# Patient Record
Sex: Female | Born: 2000 | Hispanic: No | Marital: Single | Smoking: Never smoker
Health system: Southern US, Community
[De-identification: ages and names within clinical notes are randomized; demographics above are authoritative.]

## PROBLEM LIST (undated history)

## (undated) DIAGNOSIS — T7840XA Allergy, unspecified, initial encounter: Secondary | ICD-10-CM

## (undated) HISTORY — DX: Allergy, unspecified, initial encounter: T78.40XA

---
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
## 2021-06-22 HISTORY — PX: WISDOM TOOTH EXTRACTION: SHX21

## 2021-11-21 ENCOUNTER — Ambulatory Visit (INDEPENDENT_AMBULATORY_CARE_PROVIDER_SITE_OTHER): Payer: BC Managed Care – PPO | Admitting: Family Medicine

## 2021-11-21 ENCOUNTER — Encounter: Payer: Self-pay | Admitting: Family Medicine

## 2021-11-21 VITALS — BP 99/60 | HR 99 | Temp 98.6°F | Ht 62.0 in | Wt 89.5 lb

## 2021-11-21 DIAGNOSIS — Z0000 Encounter for general adult medical examination without abnormal findings: Secondary | ICD-10-CM

## 2021-11-21 NOTE — Progress Notes (Signed)
Phone [phone]   Subjective:   Patient is a 21 y.o. female presenting for annual physical.    Chief Complaint  Patient presents with   Establish Care    Need annual check-up   New pt.  Here for cpx Was seeing peds Painful menses-on ocp and doing well.  Not SA ever.   Wt has lost few #.  Had URI so not eating as much.   See problem oriented charting- ROS- ROS: Gen: no fever, chills  Skin: no rash, itching ENT: no ear pain, ear drainage, nasal congestion, rhinorrhea, sinus pressure, sore throat-seasonal allergies-claritin D works still ok.  Eyes: no blurry vision, double vision Resp: no cough, wheeze,SOB.   Walks. CV: no CP, palpitations, LE edema,  GI: no heartburn, n/v/d/c, abd pain GU: no dysuria, urgency, frequency, hematuria MSK: no joint pain, myalgias, back pain Neuro: no dizziness, weakness, vertigo.  Occ ha prior menses-no aura. Occ midol or ibu.   Psych: no depression, anxiety, insomnia, SI   The following were reviewed and entered/updated in epic: Past Medical History:  Diagnosis Date   Allergy childhood   seasonal allergies   There are no problems to display for this patient.  History reviewed. No pertinent surgical history.  Family History  Problem Relation Age of Onset   Hypertension Mother    Cancer Father    Hypertension Father    Parkinson's disease Father    Diabetes Paternal Grandfather    Hypertension Paternal Grandfather    Alzheimer's disease Paternal Grandfather     Medications- reviewed and updated Current Outpatient Medications  Medication Sig Dispense Refill   loratadine-pseudoephedrine (CLARITIN-D 24-HOUR) 10-240 MG 24 hr tablet      norethindrone-ethinyl estradiol-FE (LOESTRIN FE) 1-20 MG-MCG tablet      No current facility-administered medications for this visit.    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 DP 2+B LUNGS: CTAB. No wheezes ABDOMEN:  BS+, soft, NTND, No HSM, no masses EXT:  no edema MSK: no gross abnormalities.  NEURO: A&O x3.  CN II-XII intact.  PSYCH: normal mood. Good eye contact     Assessment and Plan   Health Maintenance counseling: 1. Anticipatory guidance: Patient counseled regarding regular dental exams q6 months, eye exams,  avoiding smoking and second hand smoke, limiting alcohol to 1 beverage per day, no illicit drugs.   2. Risk factor reduction:  Advised patient of need for regular exercise and diet rich and fruits and vegetables to reduce risk of heart attack and stroke. Exercise- regularly.  Wt Readings from Last 3 Encounters:  11/21/21 89 lb 8 oz (40.6 kg) (<1 %, Z= -2.89)*   * Growth percentiles are based on CDC (Girls, 2-20 Years) data.   3. Immunizations/screenings/ancillary studies Immunization History  Administered Date(s) Administered   DTaP 06/20/2002, 08/22/2002, 10/24/2002,  10/12/2003, 09/16/2006   H1N1 06/11/2008   HPV 9-valent 12/21/2014, 02/27/2015, 07/17/2015   Hepatitis A 12/21/2014, 07/17/2015   Hepatitis B 06/20/2002, 08/22/2002, 10/24/2002   HiB (PRP-OMP) 06/20/2002, 08/22/2002, 10/24/2002, 10/12/2003   IPV 06/20/2002, 08/22/2002, 10/24/2002, 09/16/2006   Influenza, Quadrivalent, Recombinant, Inj, Pf 05/19/2014, 06/02/2015, 06/13/2016   Influenza,Quad,Nasal, Live 06/13/2007, 07/15/2007, 05/16/2008, 06/29/2009, 06/19/2011, 04/24/2012, 05/07/2013   Influenza-Unspecified  05/21/2006, 07/14/2008, 05/13/2021   MMR 04/18/2003, 09/16/2006   MenQuadfi_Meningococcal Groups ACYW Conjugate 12/21/2014, 01/16/2019   Meningococcal B Recombinant 01/16/2019, 01/28/2019   PFIZER(Purple Top)SARS-COV-2 Vaccination 09/21/2019, 10/16/2019, 04/21/2020   Pfizer Covid-19 Vaccine Bivalent Booster 26yr & up 05/13/2021   Pneumococcal Conjugate PCV 7 06/20/2002, 08/22/2002, 07/13/2003   Tdap 12/21/2014   Varicella 04/18/2003, 09/16/2006   There are no preventive care reminders to display for this patient.   4. Cervical cancer screening: n/a 5. Skin cancer screening- advised regular sunscreen use. Denies worrisome, changing, or new skin lesions.  6. Birth control/STD check: n/a 7. Smoking associated screening: non smoker 8. Alcohol screening: none  Problem List Items Addressed This Visit   None Visit Diagnoses     Wellness examination    -  Primary       Recommended follow up: annual Return in about 1 year (around 11/22/2022) for annual. Future Appointments  Date Time Provider DDumas 11/24/2022  8:00 AM KTawnya Crook MD [facility]    Lab/Order associations:n/a fasting   ICD-10-CM   1. Wellness examination  Z00.00      Wellness-antic guidance.  Rhm utd.  Monitor wts.   No orders of the defined types were placed in this encounter.    AWellington Hampshire MD

## 2021-11-21 NOTE — Patient Instructions (Signed)
Welcome to [facility]! It was a pleasure meeting you today.  As discussed, Please schedule a 12 month follow up visit today.  If weight continues downward, let me know  PLEASE NOTE:  If you had any LAB tests please let us know if you have not heard back within a few days. You may see your results on MyChart before we have a chance to review them but we will give you a call once they are reviewed by [place]. If we ordered any REFERRALS today, please let us know if you have not heard from their office within the next week.  Let us know through MyChart if you are needing REFILLS, or have your pharmacy send [place] the request. You can also use MyChart to communicate with me or any office staff.  Please try these tips to maintain a healthy lifestyle:  Eat most of your calories during the day when you are active. Eliminate processed foods including packaged sweets (pies, cakes, cookies), reduce intake of potatoes, white bread, white pasta, and white rice. Look for whole grain options, oat flour or almond flour.  Each meal should contain half fruits/vegetables, one quarter protein, and one quarter carbs (no bigger than a computer mouse).  Cut down on sweet beverages. This includes juice, soda, and sweet tea. Also watch fruit intake, though this is a healthier sweet option, it still contains natural sugar! Limit to 3 servings daily.  Drink at least 1 glass of water with each meal and aim for at least 8 glasses per day  Exercise at least 150 minutes every week.

## 2021-12-12 ENCOUNTER — Telehealth: Payer: Self-pay | Admitting: Family Medicine

## 2021-12-12 ENCOUNTER — Other Ambulatory Visit: Payer: Self-pay | Admitting: *Deleted

## 2021-12-12 MED ORDER — LORATADINE-PSEUDOEPHEDRINE ER 10-240 MG PO TB24: 1.0000 | ORAL_TABLET | Freq: Every day | ORAL | 2 refills | Status: DC

## 2021-12-15 NOTE — Telephone Encounter (Signed)
Rx sent to the pharmacy.

## 2021-12-26 DIAGNOSIS — B2790 Infectious mononucleosis, unspecified without complication: Secondary | ICD-10-CM | POA: Diagnosis not present

## 2021-12-26 DIAGNOSIS — J029 Acute pharyngitis, unspecified: Secondary | ICD-10-CM | POA: Diagnosis not present

## 2022-01-26 ENCOUNTER — Other Ambulatory Visit: Payer: Self-pay | Admitting: Family Medicine

## 2022-01-26 ENCOUNTER — Telehealth: Payer: Self-pay | Admitting: Family Medicine

## 2022-01-26 MED ORDER — NORETHIN ACE-ETH ESTRAD-FE 1-20 MG-MCG PO TABS: 1.0000 | ORAL_TABLET | Freq: Every day | ORAL | 4 refills | Status: DC

## 2022-01-26 NOTE — Telephone Encounter (Signed)
Okay to refill? Please advise.  

## 2022-01-26 NOTE — Telephone Encounter (Signed)
..   Encourage patient to contact the pharmacy for refills or they can request refills through [facility]  LAST APPOINTMENT DATE: 11/21/21  NEXT APPOINTMENT DATE: 11/24/22  MEDICATION:  Loestrin FE  Is the patient out of medication?   [facility]   Let patient know to contact pharmacy at the end of the day to make sure medication is ready.  Please notify patient to allow 48-72 hours to process  CLINICAL FILLS OUT ALL BELOW:   LAST REFILL:  QTY:  REFILL DATE:    OTHER COMMENTS:    Okay for refill?  Please advise

## 2022-02-26 ENCOUNTER — Other Ambulatory Visit: Payer: Self-pay | Admitting: Family Medicine

## 2022-03-16 ENCOUNTER — Encounter: Payer: Self-pay | Admitting: *Deleted

## 2022-06-11 DIAGNOSIS — M278 Other specified diseases of jaws: Secondary | ICD-10-CM | POA: Diagnosis not present

## 2022-07-01 ENCOUNTER — Other Ambulatory Visit: Payer: Self-pay | Admitting: Family Medicine

## 2022-11-24 ENCOUNTER — Encounter: Payer: Self-pay | Admitting: Family Medicine

## 2022-11-24 ENCOUNTER — Ambulatory Visit (INDEPENDENT_AMBULATORY_CARE_PROVIDER_SITE_OTHER): Payer: BC Managed Care – PPO | Admitting: Family Medicine

## 2022-11-24 VITALS — BP 100/70 | HR 102 | Temp 98.1°F | Resp 16 | Ht 62.0 in | Wt 92.0 lb

## 2022-11-24 DIAGNOSIS — Z0001 Encounter for general adult medical examination with abnormal findings: Secondary | ICD-10-CM | POA: Diagnosis not present

## 2022-11-24 DIAGNOSIS — H6121 Impacted cerumen, right ear: Secondary | ICD-10-CM

## 2022-11-24 MED ORDER — NORETHIN ACE-ETH ESTRAD-FE 1-20 MG-MCG PO TABS: 1.0000 | ORAL_TABLET | Freq: Every day | ORAL | 4 refills | Status: DC

## 2022-11-24 NOTE — Patient Instructions (Addendum)
It was very nice to see you today!  Debrox daily for 5 days  and peroxide in the afternoon.  then flush again.     PLEASE NOTE:  If you had any lab tests please let us know if you have not heard back within a few days. You may see your results on MyChart before we have a chance to review them but we will give you a call once they are reviewed by [place]. If we ordered any referrals today, please let us know if you have not heard from their office within the next week.   Please try these tips to maintain a healthy lifestyle:  Eat most of your calories during the day when you are active. Eliminate processed foods including packaged sweets (pies, cakes, cookies), reduce intake of potatoes, white bread, white pasta, and white rice. Look for whole grain options, oat flour or almond flour.  Each meal should contain half fruits/vegetables, one quarter protein, and one quarter carbs (no bigger than a computer mouse).  Cut down on sweet beverages. This includes juice, soda, and sweet tea. Also watch fruit intake, though this is a healthier sweet option, it still contains natural sugar! Limit to 3 servings daily.  Drink at least 1 glass of water with each meal and aim for at least 8 glasses per day  Exercise at least 150 minutes every week.

## 2022-11-24 NOTE — Progress Notes (Signed)
Phone [phone]   Subjective:   Patient is a 22 y.o. female presenting for annual physical.    Chief Complaint  Patient presents with   Annual Exam    CPE Fasting Possible hemorrhoid    Annual.  Walks.  Anxious at doctor's or appointment so tachycardia.  Oral contraceptive pill(s) for cycles.  Not SA  See problem oriented charting- ROS- ROS: Gen: no fever, chills  Skin: no rash, itching ENT: no ear pain, ear drainage, nasal congestion, rhinorrhea, sinus pressure, sore throat.  Some seasonal allergies.  Uses Claritin D.  Occasional harder to equalize right ear since Nov. No pain.   Eyes: no blurry vision, double vision Resp: no cough, wheeze,SOB CV: no CP, palpitations, LE edema,  GI: no heartburn, n/v/d/c, abd pain.  Occasional bright red blood per rectum only on tissue.  Occasional itchy.  No pain. Harder stools.  Bowel movement q 2-3 days.  GU: no dysuria, urgency, frequency, hematuria.  Menses monthly on oral contraceptive pill(s).  No cramps.  G0.  Never SA MSK: no joint pain, myalgias, back pain.  Some TMJ Neuro: no dizziness, weakness, vertigo.  Occasional headache(s) prior menses.   Psych: no depression, anxiety, insomnia, SI   The following were reviewed and entered/updated in epic: Past Medical History:  Diagnosis Date   Allergy childhood   seasonal allergies   There are no problems to display for this patient.  Past Surgical History:  Procedure Laterality Date   WISDOM TOOTH EXTRACTION  2023    Family History  Problem Relation Age of Onset   Hypertension Mother    Cancer Father        skin   Hypertension Father    Parkinson's disease Father    Diabetes Paternal Grandfather    Hypertension Paternal Grandfather    Alzheimer's disease Paternal Grandfather     Medications- reviewed and updated Current Outpatient Medications  Medication Sig Dispense Refill   [facility] ALLERGY RELIEF-D 10-240 MG 24 hr tablet TAKE 1 TABLET BY MOUTH EVERY DAY 60 tablet 1    norethindrone-ethinyl estradiol-FE (LOESTRIN FE) 1-20 MG-MCG tablet Take 1 tablet by mouth daily. 84 tablet 4   No current facility-administered medications for this visit.    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 DP 2+B LUNGS: CTAB. No wheezes ABDOMEN:  BS+, soft, NTND, No HSM, no masses EXT:  no edema MSK: no gross abnormalities. MS 5/5 all 4 NEURO: A&O x3.  CN II-XII intact.  PSYCH: normal mood. Good eye contact   Procedure note-verbal consent obtained.  Irrit right ear w/H2O2 and H2O.  Wax loosened but not resolved-terminated due to dizziness.       Assessment and Plan   Health Maintenance counseling: 1. Anticipatory guidance: Patient counseled regarding regular dental exams q6 months, eye exams,  avoiding smoking and second hand smoke, limiting alcohol to 1 beverage per day, no illicit drugs.   2. Risk factor reduction:  Advised patient of need for regular exercise and diet rich and fruits and vegetables to reduce risk of heart attack and stroke. Exercise- +.  Wt Readings from Last 3 Encounters:  11/24/22 92 lb (41.7 kg)  11/21/21 89 lb 8 oz (40.6 kg) (<1 %, Z= -2.89)*   * Growth percentiles are based on CDC (Girls, 2-20 Years) data.   3. Immunizations/screenings/ancillary studies Immunization History  Administered Date(s) Administered   DTaP 06/20/2002, 08/22/2002,  10/24/2002, 10/12/2003, 09/16/2006   H1N1 06/11/2008   HIB (PRP-OMP) 06/20/2002, 08/22/2002, 10/24/2002, 10/12/2003   HPV 9-valent 12/21/2014, 02/27/2015, 07/17/2015   Hepatitis A 12/21/2014, 07/17/2015   Hepatitis B 06/20/2002, 08/22/2002, 10/24/2002   IPV 06/20/2002, 08/22/2002, 10/24/2002, 09/16/2006   Influenza, Quadrivalent, Recombinant, Inj, Pf 05/19/2014, 06/02/2015, 06/13/2016   Influenza,Quad,Nasal, Live 06/13/2007, 07/15/2007, 05/16/2008, 06/29/2009, 06/19/2011, 04/24/2012, 05/07/2013   Influenza-Unspecified 05/21/2006, 07/14/2008, 05/13/2021, 03/16/2022   MMR 04/18/2003, 09/16/2006   MenQuadfi_Meningococcal Groups ACYW Conjugate 12/21/2014, 01/16/2019   Meningococcal B Recombinant 01/16/2019, 01/28/2019   PFIZER(Purple Top)SARS-COV-2 Vaccination 09/21/2019, 10/16/2019, 04/21/2020   Pfizer Covid-19 Vaccine Bivalent Booster 75yrs & up 05/13/2021   Pneumococcal Conjugate PCV 7 06/20/2002, 08/22/2002, 07/13/2003   Tdap 12/21/2014   Varicella 04/18/2003, 09/16/2006   There are no preventive care reminders to display for this patient.   4. Cervical cancer screening: n/a 5. Skin cancer screening- advised regular sunscreen use. Denies worrisome, changing, or new skin lesions.  6. Birth control/STD check: n/a 7. Smoking associated screening: non smoker 8. Alcohol screening: none  Encounter for general adult medical examination with abnormal findings  Impacted cerumen of right ear  Other orders -     Norethin Ace-Eth Estrad-FE; Take 1 tablet by mouth daily.  Dispense: 84 tablet; Refill: 4   Annual-antic guidance.  Labs not indicated.  Renewed oral contraceptive pill(s) for cycle control Cerumen impaction-could not get resolved w/irrig due to dizziness.  Use debrox and H2O2 for 5 days and return if not improved.    Recommended follow up: Return in about 1 year (around 11/24/2023) for annual physical 1 yr.  .  Lab/Order associations:+ fasting   Angelena Sole, MD

## 2022-12-28 ENCOUNTER — Other Ambulatory Visit: Payer: Self-pay | Admitting: Family Medicine

## 2022-12-30 ENCOUNTER — Telehealth: Payer: Self-pay | Admitting: *Deleted

## 2022-12-30 ENCOUNTER — Encounter: Payer: Self-pay | Admitting: *Deleted

## 2022-12-30 NOTE — Telephone Encounter (Signed)
Patient notified of message below.

## 2022-12-30 NOTE — Telephone Encounter (Signed)
Patient stated that pharmacy gave her Aurouela, instead of the birth control she normally get Junel FE, she wanted to know there were any differences in them before she started taking it. Please advise.

## 2023-05-12 IMAGING — MR MR [PERSON_NAME]
13 series · 16 of 16 positions shown · non-contrast
Comparison: None.

CLINICAL DATA: Decreased range of motion. No known injury.

EXAM:
MRI OF TEMPOROMANDIBULAR JOINT WITHOUT CONTRAST
TECHNIQUE: Multiplanar, multisequence MR imaging of the temporomandibular joint
was performed following the standard protocol. No intravenous
contrast was administered. Pt added 2 sticks, totaling a 30mm bite
block

[Series 5: T1 · axial · 4.0mm · 0.59mm/px · z∈[-16,+32]mm · 2 of 13 slices shown (1 of 2)]
[im 1/13]
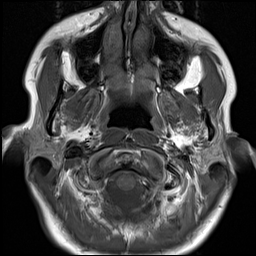
[im 13/13]
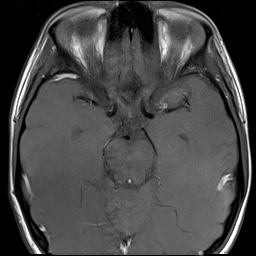

[Series 6: T2 fat-sat · sagittal · 4.0mm · 0.62mm/px · 2 of 11 slices shown (1 of 2)]
[im 1/11]
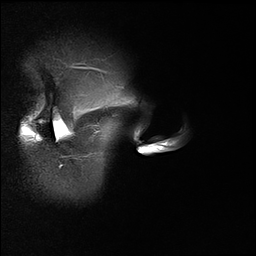
[im 11/11]
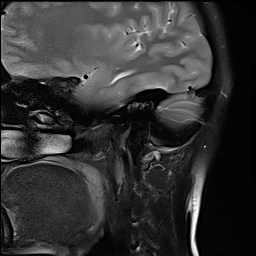

[Series 7: T2 fat-sat · sagittal · 4.0mm · 0.62mm/px · 2 of 11 slices shown (2 of 2)]
[im 1/11]
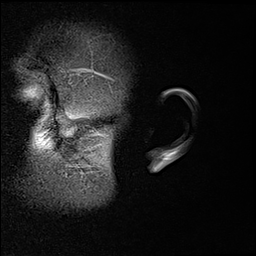
[im 11/11]
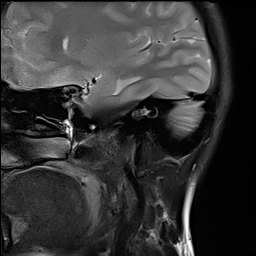

[Series 8: PD · coronal · 4.0mm · 0.44mm/px · 1 of 13 slices shown (1 of 9)]
[im 1/13]
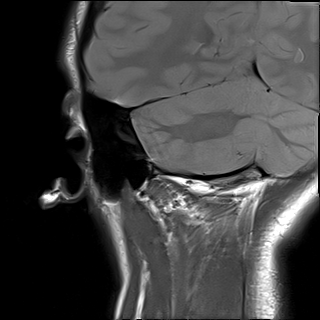

[Series 9: PD · coronal · 4.0mm · 0.44mm/px · 1 of 13 slices shown (2 of 9)]
[im 1/13]
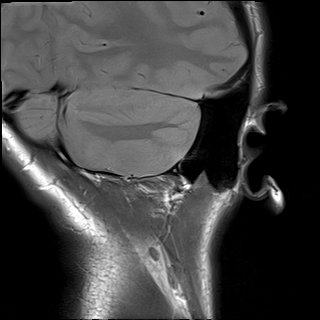

[Series 10: PD · sagittal · 4.0mm · 0.44mm/px · 1 of 11 slices shown (3 of 9)]
[im 1/11]
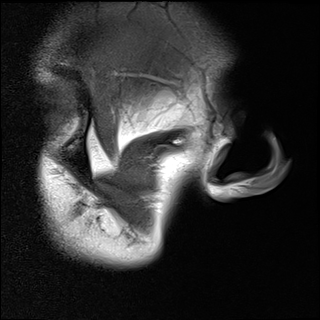

[Series 11: PD · sagittal · 4.0mm · 0.44mm/px · 1 of 9 slices shown (4 of 9)]
[im 1/9]
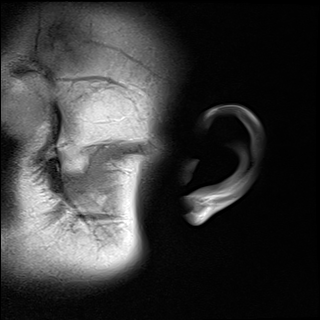

[Series 12: T1 · axial · 4.0mm · 0.31mm/px · 1 of 13 slices shown (2 of 2)]
[im 1/13]
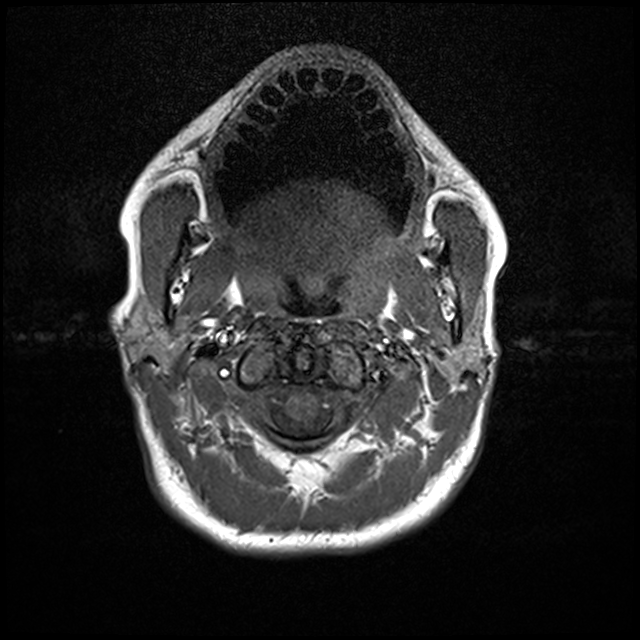

[Series 13: PD · sagittal · 4.0mm · 0.44mm/px · 1 of 11 slices shown (5 of 9)]
[im 1/11]
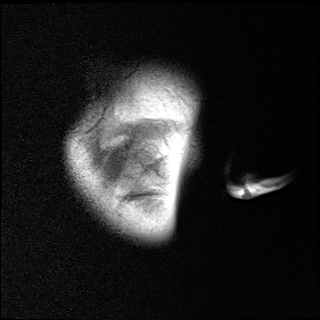

[Series 14: PD · sagittal · 4.0mm · 0.44mm/px · 1 of 11 slices shown (6 of 9)]
[im 1/11]
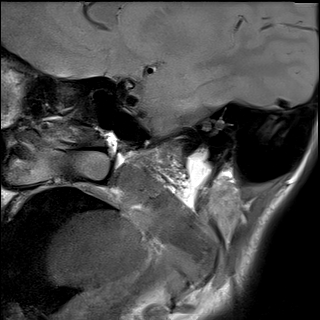

[Series 15: PD · coronal · 4.0mm · 0.44mm/px · 1 of 13 slices shown (7 of 9)]
[im 1/13]
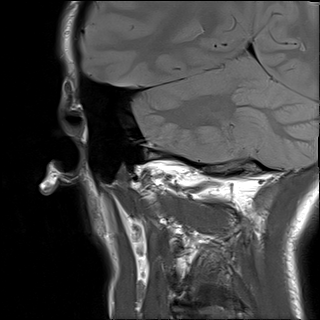

[Series 16: PD · coronal · 4.0mm · 0.44mm/px · 1 of 13 slices shown (8 of 9)]
[im 1/13]
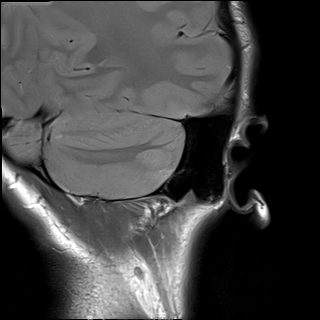

[Series 17: PD · sagittal · 4.0mm · 0.44mm/px · 1 of 11 slices shown (9 of 9)]
[im 1/11]
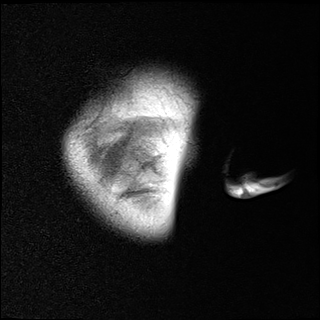

[16 of 16 positions shown; findings below may reference images not displayed]

FINDINGS: Right temporomandibular joint: The articular disc is normally
positioned between the mandibular condyle and the temporal bone in
both open and closed positions. There is normal anterior translation
of the mandibular condyle with jaw opening. There is no joint
effusion.

Left temporomandibular joint: The articular disc is normally
positioned between the mandibular condyle and the temporal bone in
both open and closed positions. There is normal anterior translation
of the mandibular condyle with jaw opening. There is no joint
effusion.

Other: No marrow signal abnormality. No acute fracture. Visualized
orbits are normal. Visualized portion of the brain is normal.
Mastoid sinuses are clear. Visualized paranasal sinuses are clear.
IMPRESSION: Normal MRI of the temporomandibular joints.

## 2023-08-01 ENCOUNTER — Other Ambulatory Visit: Payer: Self-pay | Admitting: Family Medicine

## 2023-10-04 DIAGNOSIS — J029 Acute pharyngitis, unspecified: Secondary | ICD-10-CM | POA: Diagnosis not present

## 2023-10-04 DIAGNOSIS — Z1330 Encounter for screening examination for mental health and behavioral disorders, unspecified: Secondary | ICD-10-CM | POA: Diagnosis not present

## 2023-11-25 ENCOUNTER — Encounter: Payer: BC Managed Care – PPO | Admitting: Family Medicine

## 2023-11-30 ENCOUNTER — Other Ambulatory Visit: Payer: Self-pay | Admitting: Family Medicine

## 2023-12-07 ENCOUNTER — Encounter: Admitting: Family Medicine

## 2023-12-21 ENCOUNTER — Ambulatory Visit (INDEPENDENT_AMBULATORY_CARE_PROVIDER_SITE_OTHER): Admitting: Family Medicine

## 2023-12-21 VITALS — BP 103/73 | HR 107 | Temp 98.6°F | Resp 16 | Ht 62.0 in | Wt 96.0 lb

## 2023-12-21 DIAGNOSIS — Z0000 Encounter for general adult medical examination without abnormal findings: Secondary | ICD-10-CM | POA: Diagnosis not present

## 2023-12-21 MED ORDER — NORETHIN ACE-ETH ESTRAD-FE 1-20 MG-MCG PO TABS: 1.0000 | ORAL_TABLET | Freq: Every day | ORAL | 3 refills | Status: AC

## 2023-12-21 NOTE — Patient Instructions (Signed)

## 2023-12-21 NOTE — Progress Notes (Signed)
 Phone [phone]   Subjective:   Patient is a 23 y.o. female presenting for annual physical.    Chief Complaint  Patient presents with   Annual Exam    CPE Fasting    Annual-exercise Discussed the use of AI scribe software for clinical note transcription with the patient, who gave verbal consent to proceed.  History of Present Illness Sandra Bridges is a 23 year old female who presents for an annual physical exam.  In April, she experienced an infection that led to significant throat swelling and loss of voice for about a week. Despite several tests conducted by [facility], the cause was not determined, and it was not identified as strep throat. The symptoms resolved spontaneously.  She experiences occasional stiffness in her right wrist, which sometimes extends to her middle finger. The stiffness is felt in the tendons or nerves but does not cause the finger to lock. She engages in frequent typing and mouse work, which may contribute to the symptoms. There is no associated swelling, numbness, or tingling.  She is currently taking birth control pills for cycle regulation and experiences standard menstrual pain that is manageable with Motrin or Tylenol. Her periods occur monthly with the pill, and she is not sexually active.  No major headaches, migraines, dizziness, syncope, blurry or double vision, sore throat, hoarseness, rhinorrhea, congestion, chest pain, palpitations, cough, wheezing, dyspnea, vomiting, diarrhea, constipation, or urinary difficulties. She denies any muscle aches, joint pains, or arthritis symptoms apart from the wrist issue.    See problem oriented charting- ROS- ROS: Gen: no fever, chills  Skin: no rash, itching ENT: no ear pain, ear drainage, nasal congestion, rhinorrhea, sinus pressure, sore throat Eyes: no blurry vision, double vision Resp: no cough, wheeze,SOB CV: no CP, palpitations, LE edema,  GI: no heartburn, n/v/d/c, abd pain GU:  no dysuria, urgency, frequency, hematuria MSK: no joint pain, myalgias, back pain.  Just R wrist. Neuro: no dizziness, headache, weakness, vertigo Psych: no depression, anxiety, insomnia, SI   The following were reviewed and entered/updated in epic: Past Medical History:  Diagnosis Date   Allergy childhood   seasonal allergies   There are no active problems to display for this patient.  Past Surgical History:  Procedure Laterality Date   WISDOM TOOTH EXTRACTION  2023    Family History  Problem Relation Age of Onset   Hypertension Mother    Cancer Father        Dermato fibro sarcoma protuberance (DFSP)   Hypertension Father    Parkinson's disease Father    Diabetes Paternal Grandfather    Hypertension Paternal Grandfather    Alzheimer's disease Paternal Grandfather     Medications- reviewed and updated Current Outpatient Medications  Medication Sig Dispense Refill   loratadine -pseudoephedrine  ([facility] ALLERGY RELIEF-D) 10-240 MG 24 hr tablet Take 1 tablet by mouth daily as needed for allergies. 60 tablet 1   norethindrone-ethinyl estradiol-FE (AUROVELA FE 1/20) 1-20 MG-MCG tablet Take 1 tablet by mouth daily. 84 tablet 3   No current facility-administered medications for this visit.    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 DP 2+B LUNGS: CTAB. No wheezes ABDOMEN:  BS+, soft, NTND, No HSM,  no masses EXT:  no edema MSK: no gross abnormalities. MS 5/5 all 4 NEURO: A&O x3.  CN II-XII intact.  PSYCH: normal mood. Good eye contact     Assessment and Plan   Health Maintenance counseling: 1. Anticipatory guidance: Patient counseled regarding regular dental exams q6 months, eye exams,  avoiding smoking and second hand smoke, limiting alcohol to 1 beverage per day, no illicit drugs.   2. Risk factor reduction:  Advised patient of need for regular exercise and diet rich and fruits and vegetables to reduce risk of heart attack and stroke. Exercise- +.  Wt Readings from Last 3 Encounters:  12/21/23 96 lb (43.5 kg)  11/24/22 92 lb (41.7 kg)  11/21/21 89 lb 8 oz (40.6 kg) (<1%, Z= -2.89)*   * Growth percentiles are based on CDC (Girls, 2-20 Years) data.   3. Immunizations/screenings/ancillary studies Immunization History  Administered Date(s) Administered   DTaP 06/20/2002, 08/22/2002, 10/24/2002, 10/12/2003, 09/16/2006   Dtap, Unspecified 06/20/2002, 08/22/2002, 10/24/2002, 10/12/2003, 09/16/2006   H1N1 06/11/2008, 07/14/2008   HIB (PRP-OMP) 06/20/2002, 08/22/2002, 10/24/2002, 10/12/2003   HIB, Unspecified 06/20/2002, 08/22/2002, 10/24/2002, 10/12/2003   HPV 9-valent 12/21/2014, 02/27/2015, 07/17/2015   Hepatitis A 12/21/2014, 07/17/2015   Hepatitis A, Ped/Adol-2 Dose 12/21/2014, 07/17/2015   Hepatitis B 06/20/2002, 08/22/2002, 10/24/2002   IPV 06/20/2002, 08/22/2002, 10/24/2002, 09/16/2006   Influenza Inj Mdck Quad Pf 03/16/2022   Influenza Nasal 06/13/2007, 07/15/2007, 05/16/2008, 06/29/2009, 06/18/2010, 06/19/2011   Influenza, Quadrivalent, Recombinant, Inj, Pf 05/19/2014, 06/02/2015, 06/13/2016   Influenza,Quad,Nasal, Live 06/13/2007, 07/15/2007, 05/16/2008, 06/29/2009, 06/19/2011, 04/24/2012, 05/07/2013   Influenza,inj,Quad PF,6+ Mos 05/19/2014, 06/02/2015, 06/13/2016   Influenza-Unspecified 05/21/2006, 06/11/2008, 07/14/2008, 05/13/2021, 03/16/2022, 04/10/2023   MMR  04/18/2003, 09/16/2006   MenQuadfi_Meningococcal Groups ACYW Conjugate 12/21/2014, 01/16/2019   Meningococcal B Recombinant 01/16/2019, 01/28/2019   Meningococcal B, OMV 02/17/2019   PFIZER(Purple Top)SARS-COV-2 Vaccination 09/21/2019, 10/16/2019, 04/21/2020   Pfizer Covid-19 Vaccine Bivalent Booster 63yrs & up 05/13/2021   Pfizer(Comirnaty)Fall Seasonal Vaccine 12 years and older 05/22/2023   Pneumococcal Conjugate PCV 7 06/20/2002, 08/22/2002, 07/13/2003   Pneumococcal-Unspecified 06/20/2002, 08/22/2002, 07/13/2003   Tdap 12/21/2014   Varicella 04/18/2003, 09/16/2006   Health Maintenance Due  Topic Date Due   HIV Screening  Never done   Meningococcal B Vaccine (2 of 2 - Bexsero SCDM 2-dose series) 08/20/2019   Hepatitis C Screening  Never done   Cervical Cancer Screening (Pap smear)  Never done    4. Cervical cancer screening- n/a-never SA 5. Breast cancer screening-  mammogram n/a 6. Colon cancer screening - n/a 7. Skin cancer screening- advised regular sunscreen use. Denies worrisome, changing, or new skin lesions.  8. Birth control/STD check- n/a 9. Osteoporosis screening- n/a 10. Smoking associated screening - non smoker  Wellness examination  Other orders -     Norethin  Ace-Eth Estrad-FE; Take 1 tablet by mouth daily.  Dispense: 84 tablet; Refill: 3   [facility] guidance.  Imm utd.  Declines labs.  Renewd ocp for cycle control Assessment and Plan Assessment & Plan Wrist Pain   She experiences intermittent stiffness and a locking sensation in the right wrist, occasionally affecting the middle  finger, likely due to tendinitis from frequent typing and mouse use. There is no swelling, numbness, or tingling. Recommend wrist exercises available online and using Icy Hot or Voltaren gel for local relief. Consider a wrist splint at night for several weeks if symptoms persist. Suggest physical therapy if initial measures do not improve symptoms.  General Health Maintenance    Routine physical examination shows no significant new findings. She is generally healthy, exercises regularly, and maintains a balanced diet. There is no new family history of concern. She is on birth control pills for cycle regulation and is not sexually active. She declined blood work due to personal preference and no immediate need based on symptoms or family history. Discussed starting Pap smears as per guidelines but deferred due to low risk factors. Continue current lifestyle habits, including regular exercise and a healthy diet. [facility]. Discussed the importance of seatbelt use, avoiding drinking and driving, and drug use. Encouraged regular dental visits.   Recommended follow up: Return in about 1 year (around 12/20/2024) for annual physical.  Lab/Order associations:declined labs fasting  Jenkins CHRISTELLA Carrel, MD

## 2024-01-17 ENCOUNTER — Other Ambulatory Visit: Payer: Self-pay | Admitting: Family Medicine

## 2024-05-17 ENCOUNTER — Other Ambulatory Visit: Payer: Self-pay | Admitting: Family Medicine

## 2024-12-25 ENCOUNTER — Encounter: Admitting: Family Medicine
